# Patient Record
Sex: Male | Born: 1945 | Race: White | Hispanic: No | Marital: Married | Smoking: Never smoker
Health system: Southern US, Community
[De-identification: ages and names within clinical notes are randomized; demographics above are authoritative.]

## PROBLEM LIST (undated history)

## (undated) DIAGNOSIS — I2510 Atherosclerotic heart disease of native coronary artery without angina pectoris: Secondary | ICD-10-CM

## (undated) DIAGNOSIS — T7840XA Allergy, unspecified, initial encounter: Secondary | ICD-10-CM

## (undated) DIAGNOSIS — G709 Myoneural disorder, unspecified: Secondary | ICD-10-CM

## (undated) DIAGNOSIS — U071 COVID-19: Secondary | ICD-10-CM

## (undated) DIAGNOSIS — I219 Acute myocardial infarction, unspecified: Secondary | ICD-10-CM

## (undated) DIAGNOSIS — C801 Malignant (primary) neoplasm, unspecified: Secondary | ICD-10-CM

## (undated) HISTORY — PX: CARDIAC CATHETERIZATION: SHX172

## (undated) HISTORY — DX: Allergy, unspecified, initial encounter: T78.40XA

## (undated) HISTORY — PX: ROTATOR CUFF REPAIR: SHX139

## (undated) HISTORY — PX: ROBOT ASSISTED LAPAROSCOPIC RADICAL PROSTATECTOMY: SHX5141

## (undated) HISTORY — DX: Acute myocardial infarction, unspecified: I21.9

## (undated) HISTORY — DX: Myoneural disorder, unspecified: G70.9

## (undated) HISTORY — DX: Malignant (primary) neoplasm, unspecified: C80.1

## (undated) HISTORY — DX: COVID-19: U07.1

## (undated) HISTORY — PX: COLONOSCOPY: SHX174

## (undated) HISTORY — DX: Atherosclerotic heart disease of native coronary artery without angina pectoris: I25.10

## (undated) HISTORY — PX: POLYPECTOMY: SHX149

## (undated) HISTORY — PX: OTHER SURGICAL HISTORY: SHX169

---

## 2004-12-12 ENCOUNTER — Ambulatory Visit: Payer: Self-pay | Admitting: Family Medicine

## 2008-06-19 ENCOUNTER — Inpatient Hospital Stay (HOSPITAL_COMMUNITY): Admission: RE | Admit: 2008-06-19 | Discharge: 2008-06-20 | Payer: Self-pay | Admitting: Urology

## 2008-06-19 ENCOUNTER — Encounter (INDEPENDENT_AMBULATORY_CARE_PROVIDER_SITE_OTHER): Payer: Self-pay | Admitting: Urology

## 2009-02-24 IMAGING — CR DG CHEST 2V
2 series · 2 of 2 positions shown · non-contrast
Comparison: None

CLINICAL DATA: Preop for surgery for prostate carcinoma

CHEST - 2 VIEW

[w chest pa]
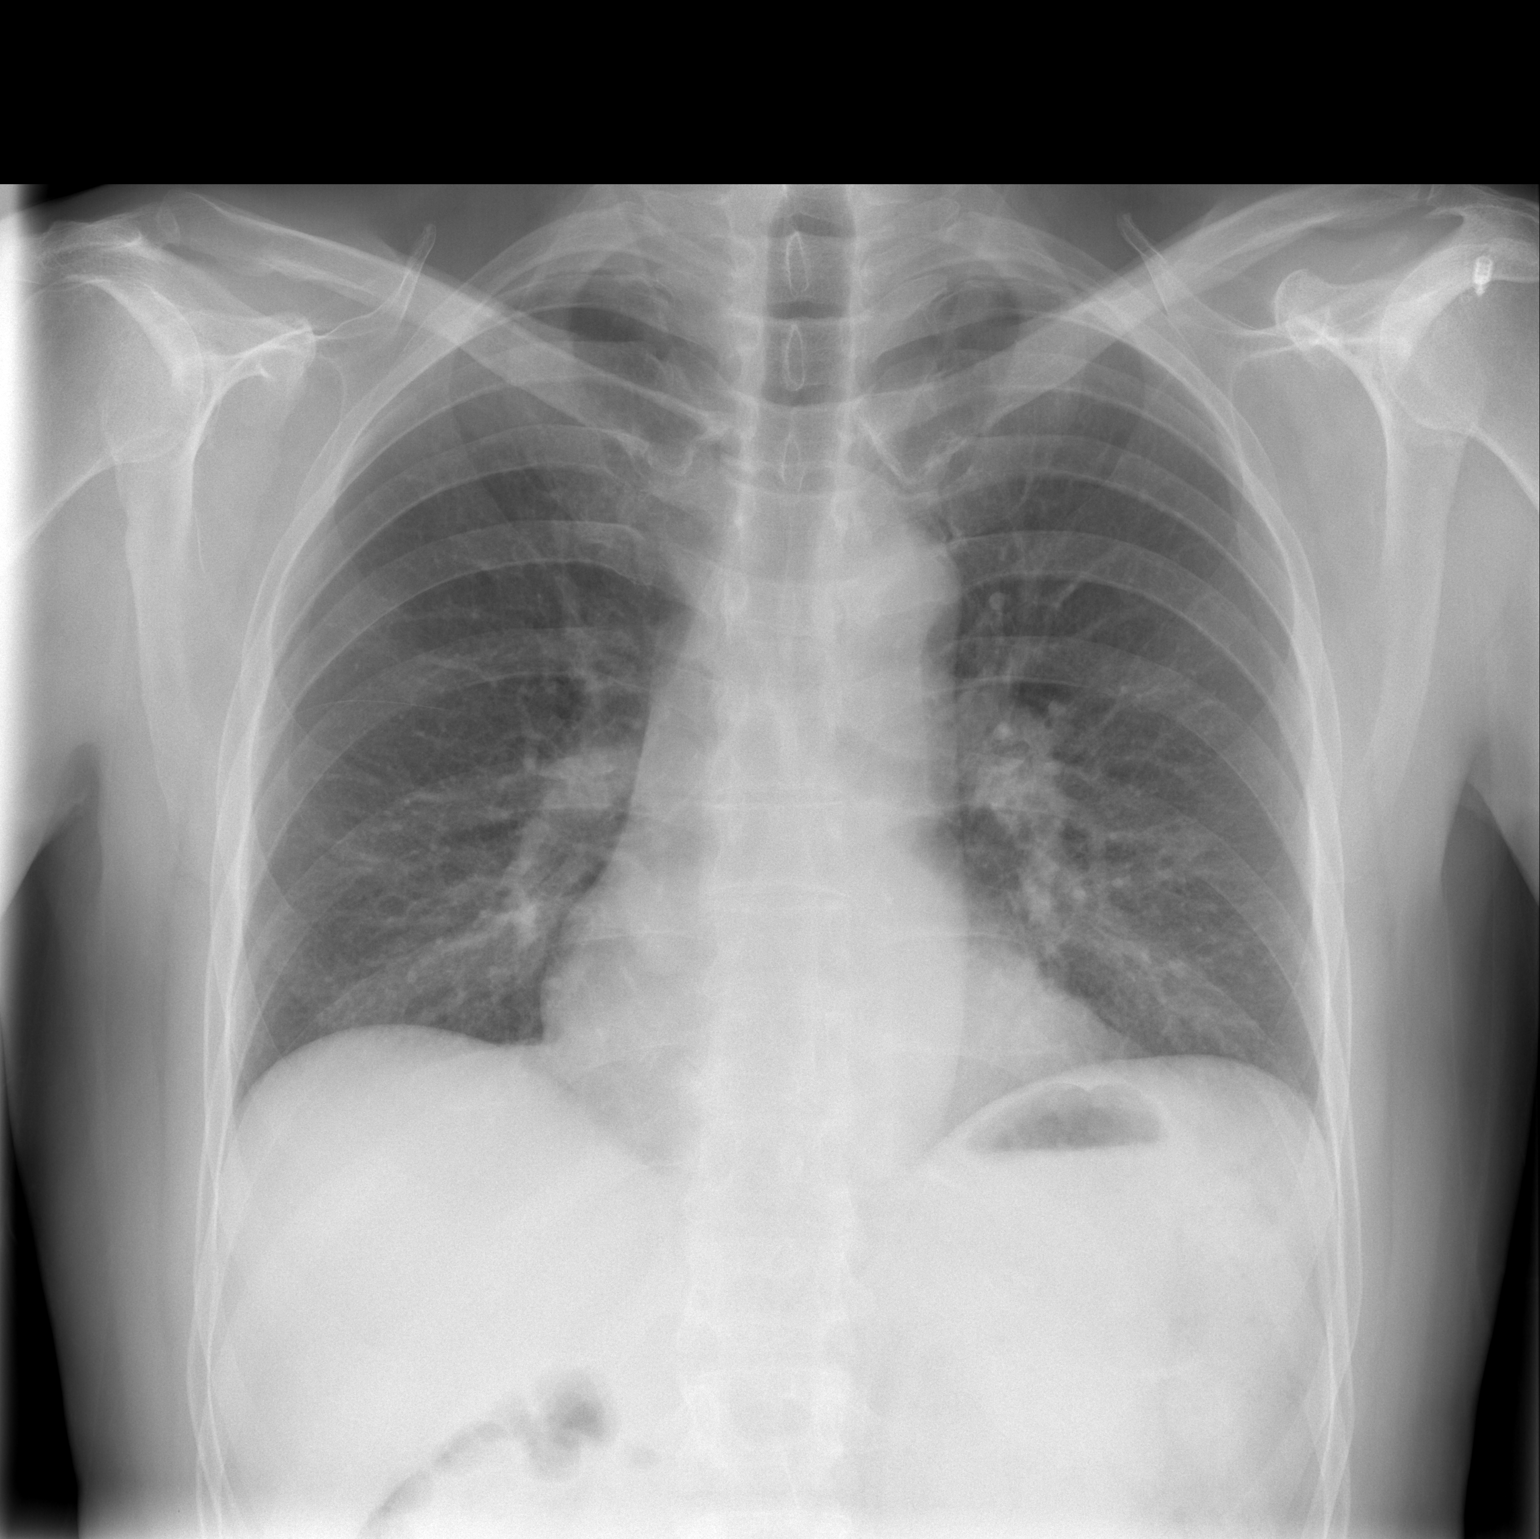

[w chest lat]
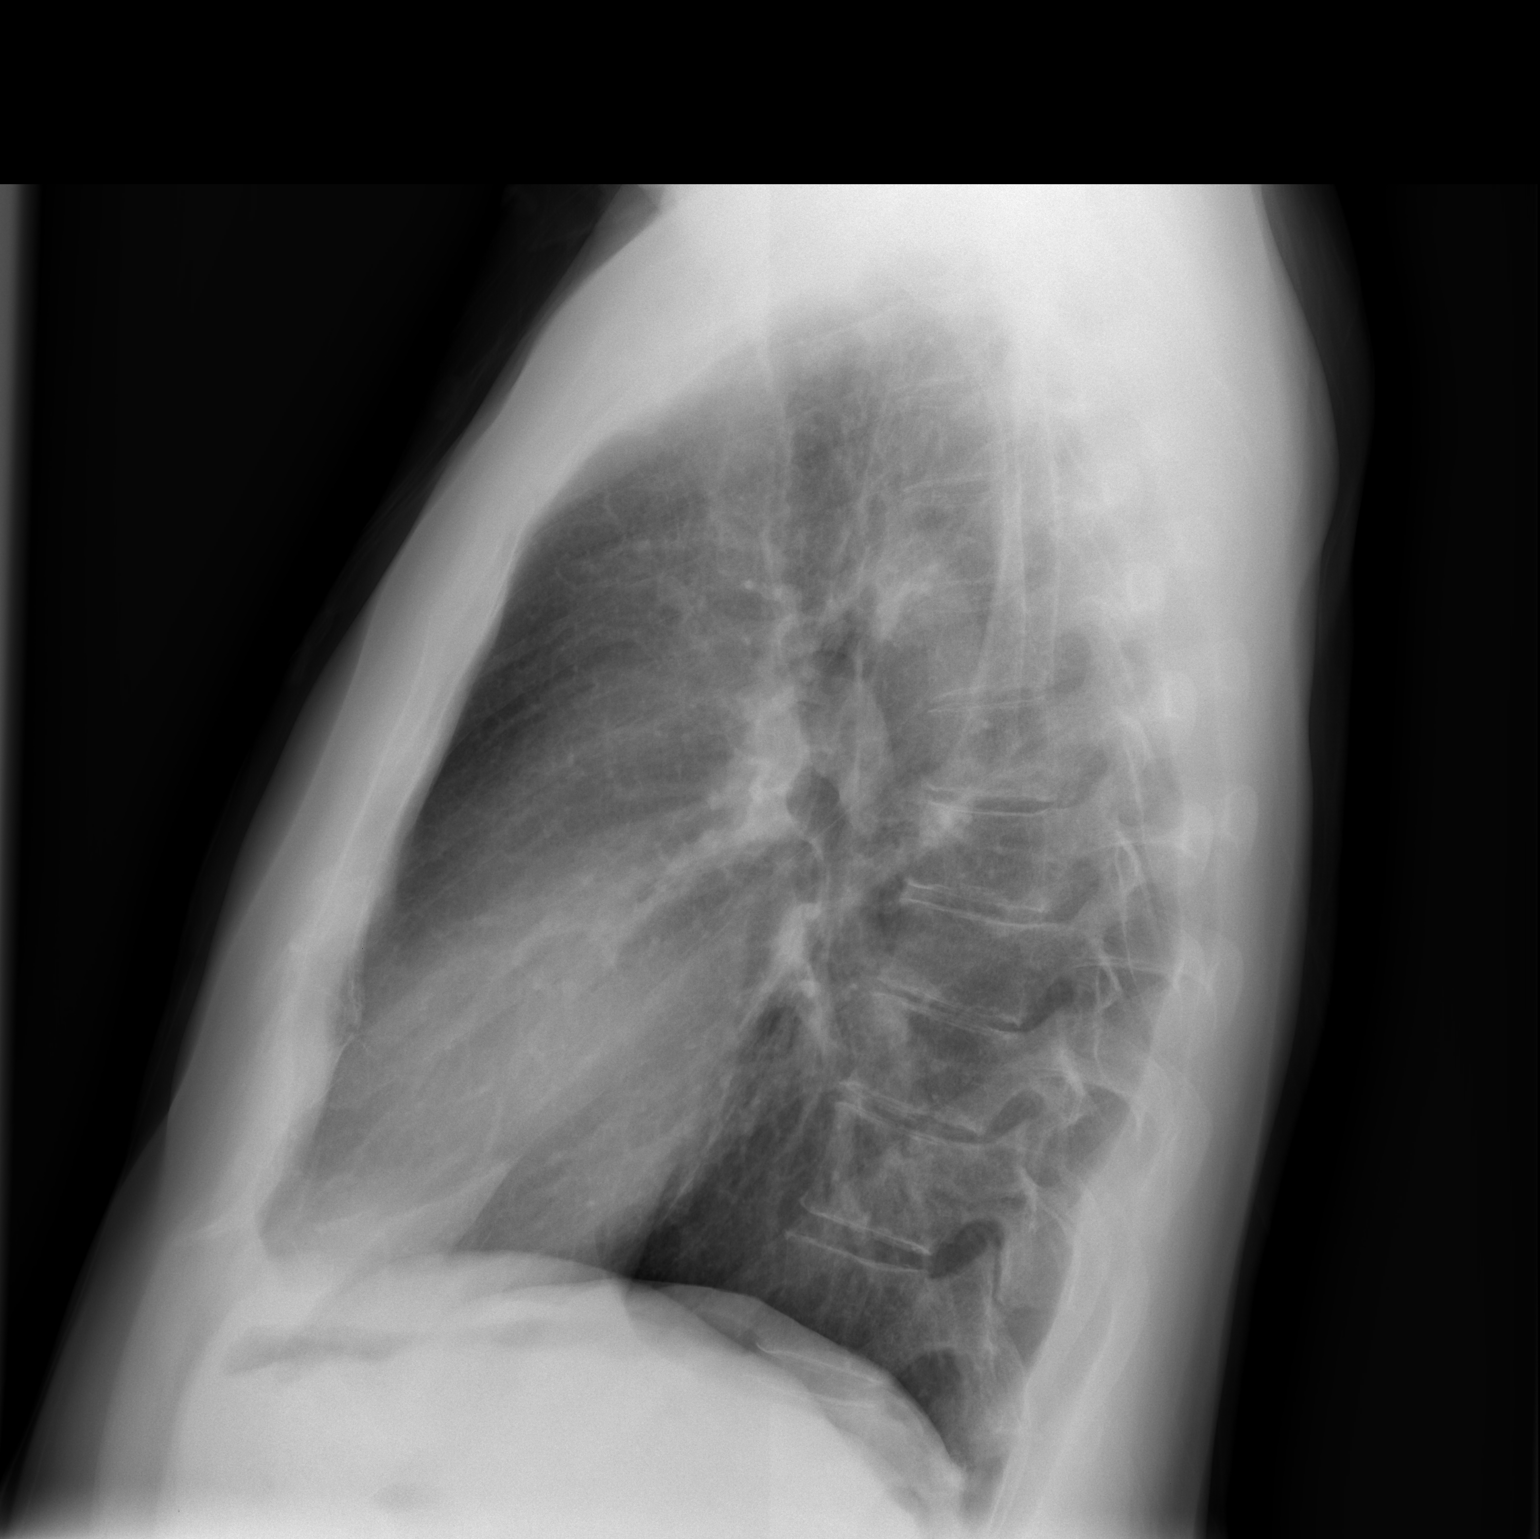

[2 of 2 positions shown; findings below may reference images not displayed]

FINDINGS: The lungs are clear.  Mild peribronchial thickening is
noted.  The heart is within normal limits in size.  No bony
abnormality is seen.
IMPRESSION: No active lung disease.

## 2011-03-16 NOTE — Op Note (Signed)
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   POSTOPERATIVE DIAGNOSIS:  Clinically localized adenocarcinoma of  prostate (clinical stage T1c NX MX).   PROCEDURES:  Robotic-assisted laparoscopic radical prostatectomy  (bilateral nerve sparing).   SURGEON:  Dr. Heloise Purpura.   ASSISTANTS:  Dr. Delman Kitten and Delia Chimes, nurse practitioner.   ANESTHESIA:  General.   COMPLICATIONS:  None.   ESTIMATED BLOOD LOSS:  350 mL.   COMPLICATIONS:  None.   SPECIMENS:  Prostate and seminal vesicles.   DISPOSITION:  Specimen to pathology.   DRAINS:  1. A #19 pelvic Blake drain.  2. A 20-French coude catheter.   INDICATIONS:  [name] is a 65 year old gentleman with clinically  localized adenocarcinoma of the prostate.  After a discussion regarding  management options for treatment, he elected to proceed with surgical  treatment and the above procedure.  The potential risks, complications,  and alternative options were discussed with the patient in detail and  informed consent was obtained.   DESCRIPTION OF PROCEDURE:  The patient was taken to the operating room  and a general anesthetic was administered.  He was given preoperative  antibiotics, placed in the dorsal lithotomy position, and prepped and  draped in the usual sterile fashion.  Next, a preoperative time-out was  performed.  A Foley catheter was inserted into the bladder and a site  was selected just to left of the umbilicus for placement of the camera  port.  This was placed using a standard open Hasson technique.   This  allowed entry into the peritoneal cavity under direct vision without  difficulty.  A 12-mm port was then placed and pneumoperitoneum was  established.  A 0-degree lens was used to inspect the abdomen and there  was no evidence for any intra-abdominal injuries or other abnormalities.  The remaining ports were then placed.  Bilateral 8-mm robotic ports were  placed 10 cm lateral to and just inferior to the camera port site.  An  additional 8-mm robotic port was placed in the far left lateral  abdominal wall.  A 5-mm port was placed between the camera port and the  right robotic port and an additional 12-mm port was placed in the far  right lateral abdominal wall for laparoscopic assistance.  All ports  were placed under direct vision without difficulty.  The surgical cart  was then docked.  With the aid of the cautery scissors, the bladder was  reflected posteriorly, allowing entry into the space of Retzius and  identification of the endopelvic fascia and prostate.  The endopelvic  fascia was then incised from the apex back to the base of the prostate  bilaterally and the underlying levator muscle fibers were swept  laterally off the prostate, thereby isolating the dorsal venous complex.  The dorsal vein was then stapled and divided with a 45-mm flex ETS  stapler.  The bladder neck was identified with the aid of Foley catheter  manipulation, and the anterior bladder neck was entered.  The Foley  catheter was identified and the catheter balloon was deflated with the  catheter brought into the operative field and used to retract the  prostate anteriorly.  The posterior bladder neck was identified and  divided.  Dissection proceeded between the bladder neck and prostate  until the vasa deferentia and seminal vesicles were identified.  The  vasa deferentia were isolated, divided and lifted anteriorly.  The  seminal vesicles were dissected down to their tips, with care to control  the  seminal vesicle arterial blood supply.  The seminal vesicles were  then lifted anteriorly.  The space between Denonvilliers' fascia and the  anterior rectum was then bluntly developed, thereby isolating the  vascular pedicles of the prostate.  The lateral prostatic fascia was  then sharply incised and the neurovascular bundles were released  posteriorly and laterally.  The vascular pedicles of the prostate were  then ligated with Hem-o-lok clips above the level of the neurovascular  bundles and divided with sharp cold scissor dissection.  The  neurovascular bundles were swept off the apex of the prostate and  urethra and urethra was sharply divided, allowing the prostate specimen  to be disarticulated.  The pelvis was then copiously irrigated and  hemostasis was ensured.  There was no evidence of a rectal injury.  Attention then turned to the urethral anastomosis.  A 2-0 Vicryl slip-  knot was placed between Denonvilliers' fascia, the posterior bladder  neck, and the posterior urethra to reapproximate these structures.  A  double-armed 3-0 Monocryl suture was then used to perform a 360-degree  running tension-free anastomosis between the bladder neck and urethra.  A new 20-French coude catheter was inserted into the bladder and  irrigated.  There were no blood clots within the bladder and the  anastomosis appeared to be watertight.  The left lateral robotic port  was removed after a #19 Blake drain was placed and appropriately  positioned.  It was secured to the skin with a nylon suture.  The  surgical cart was then undocked.  The right lateral 12-mm port site was  then closed with a 0 Vicryl suture placed with the aid of the suture  passer device.  All remaining ports were removed under direct vision and  the prostate specimen was removed intact within the Endopouch retrieval  bag via the periumbilical port site.  This fascial opening was closed  with a running 0 Vicryl suture.  All  incision sites were then injected  with 0.25% Marcaine and reapproximated at the skin level with staples.  Sterile dressings were applied.  The patient appeared to tolerate the  procedure well without complications.  He was able to be extubated and  transferred to the recovery unit in satisfactory condition.      Heloise Purpura, MD  Electronically Signed     LB/MEDQ  D:  06/19/2008  T:  06/19/2008  Job:  [id number]

## 2011-07-30 LAB — CBC
Hemoglobin: 14.2
RBC: 4.49

## 2011-07-30 LAB — BASIC METABOLIC PANEL
Calcium: 9.2
Creatinine, Ser: 1.1
GFR calc Af Amer: >60
GFR calc non Af Amer: >60
Sodium: 145

## 2016-01-12 DIAGNOSIS — R109 Unspecified abdominal pain: Secondary | ICD-10-CM | POA: Diagnosis not present

## 2016-01-12 DIAGNOSIS — Z6827 Body mass index (BMI) 27.0-27.9, adult: Secondary | ICD-10-CM | POA: Diagnosis not present

## 2016-03-10 DIAGNOSIS — Z1389 Encounter for screening for other disorder: Secondary | ICD-10-CM | POA: Diagnosis not present

## 2016-03-10 DIAGNOSIS — Z139 Encounter for screening, unspecified: Secondary | ICD-10-CM | POA: Diagnosis not present

## 2016-03-10 DIAGNOSIS — Z9181 History of falling: Secondary | ICD-10-CM | POA: Diagnosis not present

## 2016-03-10 DIAGNOSIS — Z0000 Encounter for general adult medical examination without abnormal findings: Secondary | ICD-10-CM | POA: Diagnosis not present

## 2016-03-20 DIAGNOSIS — Y939 Activity, unspecified: Secondary | ICD-10-CM | POA: Diagnosis not present

## 2016-03-20 DIAGNOSIS — X58XXXA Exposure to other specified factors, initial encounter: Secondary | ICD-10-CM | POA: Diagnosis not present

## 2016-03-20 DIAGNOSIS — T671XXA Heat syncope, initial encounter: Secondary | ICD-10-CM | POA: Diagnosis not present

## 2016-03-20 DIAGNOSIS — E860 Dehydration: Secondary | ICD-10-CM | POA: Diagnosis not present

## 2016-03-20 DIAGNOSIS — R55 Syncope and collapse: Secondary | ICD-10-CM | POA: Diagnosis not present

## 2016-03-20 DIAGNOSIS — R404 Transient alteration of awareness: Secondary | ICD-10-CM | POA: Diagnosis not present

## 2016-03-22 DIAGNOSIS — T675XXA Heat exhaustion, unspecified, initial encounter: Secondary | ICD-10-CM | POA: Diagnosis not present

## 2016-03-22 DIAGNOSIS — Z7189 Other specified counseling: Secondary | ICD-10-CM | POA: Diagnosis not present

## 2016-03-22 DIAGNOSIS — R599 Enlarged lymph nodes, unspecified: Secondary | ICD-10-CM | POA: Diagnosis not present

## 2016-03-22 DIAGNOSIS — Z6827 Body mass index (BMI) 27.0-27.9, adult: Secondary | ICD-10-CM | POA: Diagnosis not present

## 2016-06-17 DIAGNOSIS — Z1210 Encounter for screening for malignant neoplasm of intestinal tract, unspecified: Secondary | ICD-10-CM | POA: Diagnosis not present

## 2016-06-17 DIAGNOSIS — Z125 Encounter for screening for malignant neoplasm of prostate: Secondary | ICD-10-CM | POA: Diagnosis not present

## 2016-06-17 DIAGNOSIS — Z13220 Encounter for screening for lipoid disorders: Secondary | ICD-10-CM | POA: Diagnosis not present

## 2016-06-17 DIAGNOSIS — Z8546 Personal history of malignant neoplasm of prostate: Secondary | ICD-10-CM | POA: Diagnosis not present

## 2016-06-17 DIAGNOSIS — Z0000 Encounter for general adult medical examination without abnormal findings: Secondary | ICD-10-CM | POA: Diagnosis not present

## 2016-06-17 DIAGNOSIS — Z131 Encounter for screening for diabetes mellitus: Secondary | ICD-10-CM | POA: Diagnosis not present

## 2016-09-14 DIAGNOSIS — C44311 Basal cell carcinoma of skin of nose: Secondary | ICD-10-CM | POA: Diagnosis not present

## 2016-11-12 DIAGNOSIS — N5201 Erectile dysfunction due to arterial insufficiency: Secondary | ICD-10-CM | POA: Diagnosis not present

## 2016-11-12 DIAGNOSIS — C61 Malignant neoplasm of prostate: Secondary | ICD-10-CM | POA: Diagnosis not present

## 2016-12-07 DIAGNOSIS — L905 Scar conditions and fibrosis of skin: Secondary | ICD-10-CM | POA: Diagnosis not present

## 2016-12-07 DIAGNOSIS — C44311 Basal cell carcinoma of skin of nose: Secondary | ICD-10-CM | POA: Diagnosis not present

## 2017-02-08 DIAGNOSIS — C44311 Basal cell carcinoma of skin of nose: Secondary | ICD-10-CM | POA: Diagnosis not present

## 2017-02-22 DIAGNOSIS — Z0000 Encounter for general adult medical examination without abnormal findings: Secondary | ICD-10-CM | POA: Diagnosis not present

## 2017-02-22 DIAGNOSIS — Z1389 Encounter for screening for other disorder: Secondary | ICD-10-CM | POA: Diagnosis not present

## 2017-02-22 DIAGNOSIS — Z139 Encounter for screening, unspecified: Secondary | ICD-10-CM | POA: Diagnosis not present

## 2017-10-03 DIAGNOSIS — Z13220 Encounter for screening for lipoid disorders: Secondary | ICD-10-CM | POA: Diagnosis not present

## 2017-10-03 DIAGNOSIS — Z8546 Personal history of malignant neoplasm of prostate: Secondary | ICD-10-CM | POA: Diagnosis not present

## 2017-10-03 DIAGNOSIS — Z0000 Encounter for general adult medical examination without abnormal findings: Secondary | ICD-10-CM | POA: Diagnosis not present

## 2017-10-03 DIAGNOSIS — Z1211 Encounter for screening for malignant neoplasm of colon: Secondary | ICD-10-CM | POA: Diagnosis not present

## 2018-05-29 DIAGNOSIS — K4090 Unilateral inguinal hernia, without obstruction or gangrene, not specified as recurrent: Secondary | ICD-10-CM | POA: Diagnosis not present

## 2018-05-29 DIAGNOSIS — Z6826 Body mass index (BMI) 26.0-26.9, adult: Secondary | ICD-10-CM | POA: Diagnosis not present

## 2018-05-29 DIAGNOSIS — Z139 Encounter for screening, unspecified: Secondary | ICD-10-CM | POA: Diagnosis not present

## 2018-05-29 DIAGNOSIS — E663 Overweight: Secondary | ICD-10-CM | POA: Diagnosis not present

## 2018-06-07 DIAGNOSIS — K4020 Bilateral inguinal hernia, without obstruction or gangrene, not specified as recurrent: Secondary | ICD-10-CM | POA: Diagnosis not present

## 2018-06-12 DIAGNOSIS — K429 Umbilical hernia without obstruction or gangrene: Secondary | ICD-10-CM | POA: Diagnosis not present

## 2018-06-12 DIAGNOSIS — D176 Benign lipomatous neoplasm of spermatic cord: Secondary | ICD-10-CM | POA: Diagnosis not present

## 2018-06-12 DIAGNOSIS — G8918 Other acute postprocedural pain: Secondary | ICD-10-CM | POA: Diagnosis not present

## 2018-06-12 DIAGNOSIS — K4020 Bilateral inguinal hernia, without obstruction or gangrene, not specified as recurrent: Secondary | ICD-10-CM | POA: Diagnosis not present

## 2018-06-20 ENCOUNTER — Other Ambulatory Visit: Payer: Self-pay | Admitting: *Deleted

## 2018-06-20 NOTE — Patient Outreach (Signed)
[facility]) Care Management  06/20/2018  Helix Lafontaine 1946-10-13 [id number]  Referral via [place] discharged from inpatient admission 06/12/2018:  "TOC will be completed by primary care office who will refer to [facility] care management if needed.  Telephone call to patient; spouse answered call & advised that patient was not available. Spouse advised that she would have patient return call.  Plan: Geophysicist/field seismologist. Follow up 2-4 business days.  Sherrin Daisy, RN BSN [place] Management Coordinator [facility] Care Management  [phone]

## 2018-06-22 DIAGNOSIS — Z09 Encounter for follow-up examination after completed treatment for conditions other than malignant neoplasm: Secondary | ICD-10-CM | POA: Insufficient documentation

## 2018-06-26 ENCOUNTER — Other Ambulatory Visit: Payer: Self-pay | Admitting: *Deleted

## 2018-06-26 NOTE — Patient Outreach (Signed)
[facility]) Care Management  06/26/2018  Curtis Olson 1946-04-23 [id number]   Referral via [place] discharged from inpatient admission 06/12/2018:  Received return call from, patient who was advised of reason for call. HIPPA verification received from patient.   Patient voices that he is home with spouse and doing well since recent hospital stay for hernia repairs.   States he has already seen surgeon and everything looks fine with no signs of infection. States he is aware of symptoms that he needs to report to his MD.  Voices he has physical scheduled with primary care in 3 weeks. Advised patient to touch base with primary care provider to see if they wanted to see him earlier since he was recently hospitalized. Patient voices understanding and states he will call.   States he is not on any routine medications and does not have any chronic illnesses. States he is taking Advil for pain.  States he is walking daily and trying to build strength up. States he has not had any falls.  Voices he is doing fine and does not need [facility] services  this time.    Sherrin Daisy, RN BSN [place] Management Coordinator [facility] Care Management  [phone]

## 2018-06-26 NOTE — Patient Outreach (Signed)
[facility] [facility]) Care Management  06/26/2018  Curtis Olson 1946/01/25 [id number]  Referral via [place] discharged from inpatient admission 06/12/2018:  "TOC will be completed by primary care office who will refer to [facility] care management if needed.  Telephone call #2 to patient; left HIPPA compliant voice mail requesting return call.   Plan: Follow up in 2-4 days. Outreach letter was sent 06/20/2018.  Sherrin Daisy, RN BSN [place] Management Coordinator [facility] Care Management  [phone]

## 2018-06-28 ENCOUNTER — Ambulatory Visit: Payer: Self-pay | Admitting: *Deleted

## 2018-07-14 DIAGNOSIS — Z1331 Encounter for screening for depression: Secondary | ICD-10-CM | POA: Diagnosis not present

## 2018-07-14 DIAGNOSIS — Z8546 Personal history of malignant neoplasm of prostate: Secondary | ICD-10-CM | POA: Diagnosis not present

## 2018-07-14 DIAGNOSIS — Z1211 Encounter for screening for malignant neoplasm of colon: Secondary | ICD-10-CM | POA: Diagnosis not present

## 2018-07-14 DIAGNOSIS — Z139 Encounter for screening, unspecified: Secondary | ICD-10-CM | POA: Diagnosis not present

## 2018-07-14 DIAGNOSIS — R748 Abnormal levels of other serum enzymes: Secondary | ICD-10-CM | POA: Diagnosis not present

## 2018-07-14 DIAGNOSIS — Z0000 Encounter for general adult medical examination without abnormal findings: Secondary | ICD-10-CM | POA: Diagnosis not present

## 2018-07-14 DIAGNOSIS — E785 Hyperlipidemia, unspecified: Secondary | ICD-10-CM | POA: Diagnosis not present

## 2018-08-31 DIAGNOSIS — R0683 Snoring: Secondary | ICD-10-CM | POA: Diagnosis not present

## 2018-08-31 DIAGNOSIS — Z6826 Body mass index (BMI) 26.0-26.9, adult: Secondary | ICD-10-CM | POA: Diagnosis not present

## 2018-08-31 DIAGNOSIS — Z13220 Encounter for screening for lipoid disorders: Secondary | ICD-10-CM | POA: Diagnosis not present

## 2018-08-31 DIAGNOSIS — Z131 Encounter for screening for diabetes mellitus: Secondary | ICD-10-CM | POA: Diagnosis not present

## 2018-11-02 DIAGNOSIS — R0789 Other chest pain: Secondary | ICD-10-CM | POA: Diagnosis not present

## 2018-11-02 DIAGNOSIS — Z955 Presence of coronary angioplasty implant and graft: Secondary | ICD-10-CM | POA: Diagnosis not present

## 2018-11-02 DIAGNOSIS — I2510 Atherosclerotic heart disease of native coronary artery without angina pectoris: Secondary | ICD-10-CM | POA: Diagnosis not present

## 2018-11-02 DIAGNOSIS — Z8249 Family history of ischemic heart disease and other diseases of the circulatory system: Secondary | ICD-10-CM | POA: Diagnosis not present

## 2018-11-02 DIAGNOSIS — I214 Non-ST elevation (NSTEMI) myocardial infarction: Secondary | ICD-10-CM | POA: Diagnosis not present

## 2018-11-02 DIAGNOSIS — I5020 Unspecified systolic (congestive) heart failure: Secondary | ICD-10-CM | POA: Diagnosis not present

## 2018-11-02 DIAGNOSIS — Z6826 Body mass index (BMI) 26.0-26.9, adult: Secondary | ICD-10-CM | POA: Diagnosis not present

## 2018-11-02 DIAGNOSIS — M545 Low back pain: Secondary | ICD-10-CM | POA: Diagnosis not present

## 2018-11-02 DIAGNOSIS — R001 Bradycardia, unspecified: Secondary | ICD-10-CM | POA: Diagnosis not present

## 2018-11-02 DIAGNOSIS — E785 Hyperlipidemia, unspecified: Secondary | ICD-10-CM | POA: Diagnosis not present

## 2018-11-02 DIAGNOSIS — I454 Nonspecific intraventricular block: Secondary | ICD-10-CM | POA: Diagnosis not present

## 2018-11-02 DIAGNOSIS — Z7982 Long term (current) use of aspirin: Secondary | ICD-10-CM | POA: Diagnosis not present

## 2018-11-02 DIAGNOSIS — M549 Dorsalgia, unspecified: Secondary | ICD-10-CM | POA: Diagnosis not present

## 2018-11-02 DIAGNOSIS — I700 Atherosclerosis of aorta: Secondary | ICD-10-CM | POA: Diagnosis not present

## 2018-11-02 DIAGNOSIS — E7849 Other hyperlipidemia: Secondary | ICD-10-CM | POA: Diagnosis not present

## 2018-11-02 DIAGNOSIS — R072 Precordial pain: Secondary | ICD-10-CM | POA: Diagnosis not present

## 2018-11-02 DIAGNOSIS — I4510 Unspecified right bundle-branch block: Secondary | ICD-10-CM | POA: Diagnosis not present

## 2018-11-02 DIAGNOSIS — R531 Weakness: Secondary | ICD-10-CM | POA: Diagnosis not present

## 2018-11-02 DIAGNOSIS — Z8042 Family history of malignant neoplasm of prostate: Secondary | ICD-10-CM | POA: Diagnosis not present

## 2018-11-02 DIAGNOSIS — R079 Chest pain, unspecified: Secondary | ICD-10-CM | POA: Diagnosis not present

## 2018-11-03 DIAGNOSIS — I4510 Unspecified right bundle-branch block: Secondary | ICD-10-CM | POA: Diagnosis not present

## 2018-11-07 ENCOUNTER — Other Ambulatory Visit: Payer: Self-pay | Admitting: *Deleted

## 2018-11-07 NOTE — Patient Outreach (Addendum)
[place] [facility]) Care Management  11/07/2018  Curtis Olson 06-03-1946 [id number]   Transition of Care Referral   Referral Date: 11/07/2018  Referral Source: HTA IP discharge Date of Admission: unknown Diagnosis: unknown Date of Discharge:  on 11/04/18 Facility: [facility] Insurance: HTA    Transition of care services noted to be completed by primary care MD office staff also [facility]  Outreach attempt # 1 No answer. [facility] RN CM left HIPAA compliant voicemail message along with CM's contact info.   Plan: [facility] RN CM sent an unsuccessful outreach letter and scheduled this patient for another call attempt within 4 business days   Kimberly L. Lavina Hamman, RN, BSN, [place] Management Care Coordinator Direct Number [phone] Mobile number [phone]  Main [facility] number [phone] Fax number [phone]

## 2018-11-08 ENCOUNTER — Other Ambulatory Visit: Payer: Self-pay | Admitting: *Deleted

## 2018-11-08 NOTE — Patient Outreach (Signed)
[facility]) Care Management  11/08/2018  Chaka Jefferys December 16, 1945 [id number]   Opened encounter in error  [place] L. Lavina Hamman, RN, BSN, [place] Coordinator Office number [phone] Mobile number [phone]  Main [facility] number [phone] Fax number [phone]

## 2018-11-09 ENCOUNTER — Other Ambulatory Visit: Payer: Self-pay | Admitting: *Deleted

## 2018-11-09 NOTE — Patient Outreach (Signed)
[facility]) Care Management  11/09/2018  Curtis Olson 02-02-1946 [id number]   Transition of Care Referral  Referral Date: 11/07/2018  Referral Source: HTA IP discharge Date of Admission: 11/02/2018 Diagnosis: NSTEMI, chest pain, Stent placement-Let heart cath with coronary angiography Date of Discharge:  on 11/04/18 Facility: [facility] Insurance: HTA   Patient returned a call to [facility] RN CM Patient is able to verify HIPAA Reviewed and addressed EMMI red alert/referral to [facility] with patient Curtis Olson confirms his d/c from [place] center after having Chest pain, NSTEMI and stents placed   He reports his stent site is clean, dry, not swelling He informed Cm he cleaned the site to day and has left it open to air   Social: Curtis Olson lives at home with his wife (of "67 years"), [name]. 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 He was noted to have sniffles during the screening call He was offered assistance of [facility] pharmacy staff but prefers to discussed his s/s with his primary MD on 11/10/2018 and will call [facility] CM if pharmacy assist is needed    Appointments: He is scheduled to see Dr Nyra Capes on 11/10/18 - Reports being seen last   07/14/18 He is scheduled to se the cardiologist, [place] on 12/05/18   Advance Directives: Denies need for assist with advance directives    Consent: [facility] RN CM reviewed [facility] services with patient. Patient gave verbal consent for services. He denies need of services from [facility]/Telephonic RN CM, pharmacy, health coach, NP or SW at this time    Plans [facility] RN CM will close case at this time as patient has been assessed and no needs identified.    Pt encouraged to return a call to [facility] RN CM prn   [name], RN, BSN, [place] Coordinator Office number [phone] Mobile number [phone]  Main [facility] number [phone] Fax number [phone]

## 2018-11-10 DIAGNOSIS — I2510 Atherosclerotic heart disease of native coronary artery without angina pectoris: Secondary | ICD-10-CM | POA: Diagnosis not present

## 2018-11-10 DIAGNOSIS — Z1331 Encounter for screening for depression: Secondary | ICD-10-CM | POA: Diagnosis not present

## 2018-11-10 DIAGNOSIS — Z139 Encounter for screening, unspecified: Secondary | ICD-10-CM | POA: Diagnosis not present

## 2018-11-10 DIAGNOSIS — Z6826 Body mass index (BMI) 26.0-26.9, adult: Secondary | ICD-10-CM | POA: Diagnosis not present

## 2018-11-10 DIAGNOSIS — J0100 Acute maxillary sinusitis, unspecified: Secondary | ICD-10-CM | POA: Diagnosis not present

## 2018-11-10 DIAGNOSIS — Z7189 Other specified counseling: Secondary | ICD-10-CM | POA: Diagnosis not present

## 2018-12-05 DIAGNOSIS — I214 Non-ST elevation (NSTEMI) myocardial infarction: Secondary | ICD-10-CM | POA: Diagnosis not present

## 2018-12-05 DIAGNOSIS — E7800 Pure hypercholesterolemia, unspecified: Secondary | ICD-10-CM | POA: Diagnosis not present

## 2018-12-11 DIAGNOSIS — I2510 Atherosclerotic heart disease of native coronary artery without angina pectoris: Secondary | ICD-10-CM | POA: Diagnosis not present

## 2018-12-11 DIAGNOSIS — Z6826 Body mass index (BMI) 26.0-26.9, adult: Secondary | ICD-10-CM | POA: Diagnosis not present

## 2018-12-11 DIAGNOSIS — Z139 Encounter for screening, unspecified: Secondary | ICD-10-CM | POA: Diagnosis not present

## 2018-12-11 DIAGNOSIS — R42 Dizziness and giddiness: Secondary | ICD-10-CM | POA: Diagnosis not present

## 2018-12-12 DIAGNOSIS — Z955 Presence of coronary angioplasty implant and graft: Secondary | ICD-10-CM | POA: Diagnosis not present

## 2018-12-12 DIAGNOSIS — Z79899 Other long term (current) drug therapy: Secondary | ICD-10-CM | POA: Diagnosis not present

## 2018-12-12 DIAGNOSIS — Z7982 Long term (current) use of aspirin: Secondary | ICD-10-CM | POA: Diagnosis not present

## 2019-01-01 DIAGNOSIS — E785 Hyperlipidemia, unspecified: Secondary | ICD-10-CM | POA: Diagnosis not present

## 2019-01-03 DIAGNOSIS — Z7982 Long term (current) use of aspirin: Secondary | ICD-10-CM | POA: Diagnosis not present

## 2019-01-03 DIAGNOSIS — Z955 Presence of coronary angioplasty implant and graft: Secondary | ICD-10-CM | POA: Diagnosis not present

## 2019-01-03 DIAGNOSIS — Z79899 Other long term (current) drug therapy: Secondary | ICD-10-CM | POA: Diagnosis not present

## 2019-04-05 DIAGNOSIS — E785 Hyperlipidemia, unspecified: Secondary | ICD-10-CM | POA: Diagnosis not present

## 2019-04-05 DIAGNOSIS — I2510 Atherosclerotic heart disease of native coronary artery without angina pectoris: Secondary | ICD-10-CM | POA: Diagnosis not present

## 2019-04-12 DIAGNOSIS — Z139 Encounter for screening, unspecified: Secondary | ICD-10-CM | POA: Diagnosis not present

## 2019-04-12 DIAGNOSIS — E785 Hyperlipidemia, unspecified: Secondary | ICD-10-CM | POA: Diagnosis not present

## 2019-04-12 DIAGNOSIS — Z1331 Encounter for screening for depression: Secondary | ICD-10-CM | POA: Diagnosis not present

## 2019-04-12 DIAGNOSIS — Z6825 Body mass index (BMI) 25.0-25.9, adult: Secondary | ICD-10-CM | POA: Diagnosis not present

## 2019-04-12 DIAGNOSIS — Z0000 Encounter for general adult medical examination without abnormal findings: Secondary | ICD-10-CM | POA: Diagnosis not present

## 2019-04-12 DIAGNOSIS — D692 Other nonthrombocytopenic purpura: Secondary | ICD-10-CM | POA: Diagnosis not present

## 2019-04-12 DIAGNOSIS — I2510 Atherosclerotic heart disease of native coronary artery without angina pectoris: Secondary | ICD-10-CM | POA: Diagnosis not present

## 2019-06-04 ENCOUNTER — Other Ambulatory Visit: Payer: Self-pay

## 2019-06-20 DIAGNOSIS — M25571 Pain in right ankle and joints of right foot: Secondary | ICD-10-CM | POA: Diagnosis not present

## 2019-06-20 DIAGNOSIS — Z6825 Body mass index (BMI) 25.0-25.9, adult: Secondary | ICD-10-CM | POA: Diagnosis not present

## 2019-06-20 DIAGNOSIS — M25471 Effusion, right ankle: Secondary | ICD-10-CM | POA: Diagnosis not present

## 2019-06-28 DIAGNOSIS — Z6825 Body mass index (BMI) 25.0-25.9, adult: Secondary | ICD-10-CM | POA: Diagnosis not present

## 2019-06-28 DIAGNOSIS — M25471 Effusion, right ankle: Secondary | ICD-10-CM | POA: Diagnosis not present

## 2019-07-05 DIAGNOSIS — M25471 Effusion, right ankle: Secondary | ICD-10-CM | POA: Diagnosis not present

## 2019-07-05 DIAGNOSIS — Z6826 Body mass index (BMI) 26.0-26.9, adult: Secondary | ICD-10-CM | POA: Diagnosis not present

## 2019-08-09 DIAGNOSIS — Z23 Encounter for immunization: Secondary | ICD-10-CM | POA: Diagnosis not present

## 2019-08-09 DIAGNOSIS — E785 Hyperlipidemia, unspecified: Secondary | ICD-10-CM | POA: Diagnosis not present

## 2019-08-09 DIAGNOSIS — Z8546 Personal history of malignant neoplasm of prostate: Secondary | ICD-10-CM | POA: Diagnosis not present

## 2019-08-16 DIAGNOSIS — Z1211 Encounter for screening for malignant neoplasm of colon: Secondary | ICD-10-CM | POA: Diagnosis not present

## 2019-08-16 DIAGNOSIS — R748 Abnormal levels of other serum enzymes: Secondary | ICD-10-CM | POA: Diagnosis not present

## 2019-08-16 DIAGNOSIS — Z139 Encounter for screening, unspecified: Secondary | ICD-10-CM | POA: Diagnosis not present

## 2019-08-16 DIAGNOSIS — Z0000 Encounter for general adult medical examination without abnormal findings: Secondary | ICD-10-CM | POA: Diagnosis not present

## 2019-08-16 DIAGNOSIS — Z6825 Body mass index (BMI) 25.0-25.9, adult: Secondary | ICD-10-CM | POA: Diagnosis not present

## 2019-08-28 DIAGNOSIS — R748 Abnormal levels of other serum enzymes: Secondary | ICD-10-CM | POA: Diagnosis not present

## 2019-08-28 DIAGNOSIS — I2510 Atherosclerotic heart disease of native coronary artery without angina pectoris: Secondary | ICD-10-CM | POA: Diagnosis not present

## 2019-08-28 DIAGNOSIS — I252 Old myocardial infarction: Secondary | ICD-10-CM | POA: Diagnosis not present

## 2019-08-28 DIAGNOSIS — E7800 Pure hypercholesterolemia, unspecified: Secondary | ICD-10-CM | POA: Diagnosis not present

## 2019-09-24 DIAGNOSIS — Z20828 Contact with and (suspected) exposure to other viral communicable diseases: Secondary | ICD-10-CM | POA: Diagnosis not present

## 2019-09-24 DIAGNOSIS — R509 Fever, unspecified: Secondary | ICD-10-CM | POA: Diagnosis not present

## 2019-10-13 DIAGNOSIS — L03114 Cellulitis of left upper limb: Secondary | ICD-10-CM | POA: Diagnosis not present

## 2019-12-11 DIAGNOSIS — Z1331 Encounter for screening for depression: Secondary | ICD-10-CM | POA: Diagnosis not present

## 2019-12-11 DIAGNOSIS — Z139 Encounter for screening, unspecified: Secondary | ICD-10-CM | POA: Diagnosis not present

## 2019-12-11 DIAGNOSIS — Z7189 Other specified counseling: Secondary | ICD-10-CM | POA: Diagnosis not present

## 2019-12-11 DIAGNOSIS — Z136 Encounter for screening for cardiovascular disorders: Secondary | ICD-10-CM | POA: Diagnosis not present

## 2019-12-11 DIAGNOSIS — Z0000 Encounter for general adult medical examination without abnormal findings: Secondary | ICD-10-CM | POA: Diagnosis not present

## 2019-12-11 DIAGNOSIS — Z1339 Encounter for screening examination for other mental health and behavioral disorders: Secondary | ICD-10-CM | POA: Diagnosis not present

## 2019-12-11 DIAGNOSIS — E785 Hyperlipidemia, unspecified: Secondary | ICD-10-CM | POA: Diagnosis not present

## 2019-12-21 DIAGNOSIS — I2510 Atherosclerotic heart disease of native coronary artery without angina pectoris: Secondary | ICD-10-CM | POA: Diagnosis not present

## 2019-12-21 DIAGNOSIS — Z6826 Body mass index (BMI) 26.0-26.9, adult: Secondary | ICD-10-CM | POA: Diagnosis not present

## 2019-12-21 DIAGNOSIS — E785 Hyperlipidemia, unspecified: Secondary | ICD-10-CM | POA: Diagnosis not present

## 2019-12-21 DIAGNOSIS — D692 Other nonthrombocytopenic purpura: Secondary | ICD-10-CM | POA: Diagnosis not present

## 2019-12-28 DIAGNOSIS — I2510 Atherosclerotic heart disease of native coronary artery without angina pectoris: Secondary | ICD-10-CM | POA: Diagnosis not present

## 2019-12-28 DIAGNOSIS — Z136 Encounter for screening for cardiovascular disorders: Secondary | ICD-10-CM | POA: Diagnosis not present

## 2020-03-04 DIAGNOSIS — I252 Old myocardial infarction: Secondary | ICD-10-CM | POA: Diagnosis not present

## 2020-03-04 DIAGNOSIS — E7800 Pure hypercholesterolemia, unspecified: Secondary | ICD-10-CM | POA: Diagnosis not present

## 2020-03-04 DIAGNOSIS — I2510 Atherosclerotic heart disease of native coronary artery without angina pectoris: Secondary | ICD-10-CM | POA: Diagnosis not present

## 2020-05-08 DIAGNOSIS — E785 Hyperlipidemia, unspecified: Secondary | ICD-10-CM | POA: Diagnosis not present

## 2020-05-19 DIAGNOSIS — I2510 Atherosclerotic heart disease of native coronary artery without angina pectoris: Secondary | ICD-10-CM | POA: Diagnosis not present

## 2020-05-19 DIAGNOSIS — D692 Other nonthrombocytopenic purpura: Secondary | ICD-10-CM | POA: Diagnosis not present

## 2020-05-19 DIAGNOSIS — Z136 Encounter for screening for cardiovascular disorders: Secondary | ICD-10-CM | POA: Diagnosis not present

## 2020-05-19 DIAGNOSIS — Z6826 Body mass index (BMI) 26.0-26.9, adult: Secondary | ICD-10-CM | POA: Diagnosis not present

## 2020-05-19 DIAGNOSIS — E785 Hyperlipidemia, unspecified: Secondary | ICD-10-CM | POA: Diagnosis not present

## 2020-07-24 ENCOUNTER — Encounter: Payer: Self-pay | Admitting: Gastroenterology

## 2020-08-26 DIAGNOSIS — Z8546 Personal history of malignant neoplasm of prostate: Secondary | ICD-10-CM | POA: Diagnosis not present

## 2020-08-26 DIAGNOSIS — Z8616 Personal history of COVID-19: Secondary | ICD-10-CM | POA: Diagnosis not present

## 2020-08-26 DIAGNOSIS — I25118 Atherosclerotic heart disease of native coronary artery with other forms of angina pectoris: Secondary | ICD-10-CM | POA: Diagnosis not present

## 2020-08-26 DIAGNOSIS — D649 Anemia, unspecified: Secondary | ICD-10-CM | POA: Diagnosis not present

## 2020-08-26 DIAGNOSIS — Z23 Encounter for immunization: Secondary | ICD-10-CM | POA: Diagnosis not present

## 2020-08-26 DIAGNOSIS — R7303 Prediabetes: Secondary | ICD-10-CM | POA: Diagnosis not present

## 2020-08-26 DIAGNOSIS — R748 Abnormal levels of other serum enzymes: Secondary | ICD-10-CM | POA: Diagnosis not present

## 2020-08-26 DIAGNOSIS — K635 Polyp of colon: Secondary | ICD-10-CM | POA: Diagnosis not present

## 2020-09-11 ENCOUNTER — Ambulatory Visit (AMBULATORY_SURGERY_CENTER): Payer: Self-pay | Admitting: *Deleted

## 2020-09-11 ENCOUNTER — Encounter: Payer: Self-pay | Admitting: Gastroenterology

## 2020-09-11 ENCOUNTER — Other Ambulatory Visit: Payer: Self-pay

## 2020-09-11 VITALS — Ht 73.5 in | Wt 197.0 lb

## 2020-09-11 DIAGNOSIS — Z86010 Personal history of colonic polyps: Secondary | ICD-10-CM

## 2020-09-11 NOTE — Progress Notes (Signed)

## 2020-09-23 ENCOUNTER — Encounter: Payer: Self-pay | Admitting: Gastroenterology

## 2020-09-23 ENCOUNTER — Other Ambulatory Visit: Payer: Self-pay

## 2020-09-23 ENCOUNTER — Ambulatory Visit (AMBULATORY_SURGERY_CENTER): Payer: PPO | Admitting: Gastroenterology

## 2020-09-23 VITALS — BP 101/70 | HR 50 | Temp 97.3°F | Resp 13 | Ht 73.5 in | Wt 197.0 lb

## 2020-09-23 DIAGNOSIS — Z86010 Personal history of colonic polyps: Secondary | ICD-10-CM | POA: Diagnosis not present

## 2020-09-23 DIAGNOSIS — Z1211 Encounter for screening for malignant neoplasm of colon: Secondary | ICD-10-CM | POA: Diagnosis not present

## 2020-09-23 MED ORDER — SODIUM CHLORIDE 0.9 % IV SOLN: 500.0000 mL | Freq: Once | INTRAVENOUS | Status: DC

## 2020-09-23 NOTE — Op Note (Signed)
[place] Patient Name: Curtis Olson Procedure Date: 09/23/2020 11:36 AM MRN: [id number] Endoscopist: Jackquline Denmark , MD Age: 74 Referring MD:  Date of Birth: 1946-10-14 Gender: Male Account #: [id number] Procedure:                Colonoscopy Indications:              High risk colon cancer surveillance: Personal                            history of colonic polyps Medicines:                Monitored Anesthesia Care Procedure:                Pre-Anesthesia Assessment:                           - Prior to the procedure, a History and Physical                            was performed, and patient medications and                            allergies were reviewed. The patient's tolerance of                            previous anesthesia was also reviewed. The risks                            and benefits of the procedure and the sedation                            options and risks were discussed with the patient.                            All questions were answered, and informed consent                            was obtained. Prior Anticoagulants: The patient has                            taken no previous anticoagulant or antiplatelet                            agents. ASA Grade Assessment: II - A patient with                            mild systemic disease. After reviewing the risks                            and benefits, the patient was deemed in                            satisfactory condition to undergo the procedure.  After obtaining informed consent, the colonoscope                            was passed under direct vision. Throughout the                            procedure, the patient's blood pressure, pulse, and                            oxygen saturations were monitored continuously. The                            Colonoscope was introduced through the anus and                            advanced to the the cecum, identified by                             appendiceal orifice and ileocecal valve. The                            colonoscopy was performed without difficulty. The                            patient tolerated the procedure well. The quality                            of the bowel preparation was good. The ileocecal                            valve, appendiceal orifice, and rectum were                            photographed. Scope In: 11:42:52 AM Scope Out: 11:54:50 AM Scope Withdrawal Time: 0 hours 8 minutes 22 seconds  Total Procedure Duration: 0 hours 11 minutes 58 seconds  Findings:                 Multiple medium-mouthed diverticula were found in                            the sigmoid colon and few in ascending colon.                           Non-bleeding internal hemorrhoids were found during                            retroflexion. The hemorrhoids were Grade IV                            (internal hemorrhoids that prolapse and cannot be                            reduced manually).  The exam was otherwise without abnormality on                            direct and retroflexion views. Complications:            No immediate complications. Estimated Blood Loss:     Estimated blood loss: none. Impression:               - Moderate predominantly sigmoid diverticulosis                           - Non-bleeding internal hemorrhoids.                           - The examination was otherwise normal on direct                            and retroflexion views.                           - No specimens collected. Recommendation:           - Patient has a contact number available for                            emergencies. The signs and symptoms of potential                            delayed complications were discussed with the                            patient. Return to normal activities tomorrow.                            Written discharge instructions were provided to the                             patient.                           - High fiber diet.                           - Continue present medications.                           - Repeat colonoscopy is not recommended for                            screening purposes. Hence colonoscopy only if with                            any new problems.                           - Return to GI office PRN.                           -  Use Preparation H on as-needed basis.                           - The findings and recommendations were discussed                            with [name]. Jackquline Denmark, MD 09/23/2020 12:01:59 PM This report has been signed electronically.

## 2020-09-23 NOTE — Progress Notes (Signed)
Vital signs checked by:MO  The patient states no changes in medical or surgical history since pre-visit screening on 09/11/20.

## 2020-09-23 NOTE — Patient Instructions (Signed)
Handout on diverticulosis given.  High fiber diet. Use Preparation H as needed.    YOU HAD AN ENDOSCOPIC PROCEDURE TODAY AT [facility]:   Refer to the procedure report that was given to you for any specific questions about what was found during the examination.  If the procedure report does not answer your questions, please call your gastroenterologist to clarify.  If you requested that your care partner not be given the details of your procedure findings, then the procedure report has been included in a sealed envelope for you to review at your convenience later.  YOU SHOULD EXPECT: Some feelings of bloating in the abdomen. Passage of more gas than usual.  Walking can help get rid of the air that was put into your GI tract during the procedure and reduce the bloating. If you had a lower endoscopy (such as a colonoscopy or flexible sigmoidoscopy) you may notice spotting of blood in your stool or on the toilet paper. If you underwent a bowel prep for your procedure, you may not have a normal bowel movement for a few days.  Please Note:  You might notice some irritation and congestion in your nose or some drainage.  This is from the oxygen used during your procedure.  There is no need for concern and it should clear up in a day or so.  SYMPTOMS TO REPORT IMMEDIATELY:   Following lower endoscopy (colonoscopy or flexible sigmoidoscopy):  Excessive amounts of blood in the stool  Significant tenderness or worsening of abdominal pains  Swelling of the abdomen that is new, acute  Fever of 100F or higher   For urgent or emergent issues, a gastroenterologist can be reached at any hour by calling [phone]. Do not use MyChart messaging for urgent concerns.    DIET:  We do recommend a small meal at first, but then you may proceed to your regular diet.  Drink plenty of fluids but you should avoid alcoholic beverages for 24 hours.  ACTIVITY:  You should plan to take it easy for  the rest of today and you should NOT DRIVE or use heavy machinery until tomorrow (because of the sedation medicines used during the test).    FOLLOW UP: Our staff will call the number listed on your records 48-72 hours following your procedure to check on you and address any questions or concerns that you may have regarding the information given to you following your procedure. If we do not reach you, we will leave a message.  We will attempt to reach you two times.  During this call, we will ask if you have developed any symptoms of COVID 19. If you develop any symptoms (ie: fever, flu-like symptoms, shortness of breath, cough etc.) before then, please call [phone].  If you test positive for Covid 19 in the 2 weeks post procedure, please call and report this information to [place].    If any biopsies were taken you will be contacted by phone or by letter within the next 1-3 weeks.  Please call us at [phone] if you have not heard about the biopsies in 3 weeks.    SIGNATURES/CONFIDENTIALITY: You and/or your care partner have signed paperwork which will be entered into your electronic medical record.  These signatures attest to the fact that that the information above on your After Visit Summary has been reviewed and is understood.  Full responsibility of the confidentiality of this discharge information lies with you and/or your care-partner.

## 2020-09-23 NOTE — Progress Notes (Signed)
Pt Drowsy. VSS. To PACU, report to RN. No anesthetic complications noted.  

## 2020-09-24 ENCOUNTER — Telehealth: Payer: Self-pay | Admitting: *Deleted

## 2020-09-24 NOTE — Telephone Encounter (Signed)
  Follow up Call-  Call back number 09/23/2020  Post procedure Call Back phone  # [phone]  Permission to leave phone message Yes  Some recent data might be hidden     Patient questions:  Do you have a fever, pain , or abdominal swelling? No. Pain Score  0 *  Have you tolerated food without any problems? Yes.    Have you been able to return to your normal activities? Yes.    Do you have any questions about your discharge instructions: Diet   No. Medications  No. Follow up visit  No.  Do you have questions or concerns about your Care? No.  Actions: * If pain score is 4 or above: 1. No action needed, pain <4.Have you developed a fever since your procedure? no  2.   Have you had an respiratory symptoms (SOB or cough) since your procedure? no  3.   Have you tested positive for COVID 19 since your procedure no  4.   Have you had any family members/close contacts diagnosed with the COVID 19 since your procedure?  no   If yes to any of these questions please route to Joylene John, RN and Joella Prince, RN

## 2021-03-10 DIAGNOSIS — E782 Mixed hyperlipidemia: Secondary | ICD-10-CM | POA: Diagnosis not present

## 2021-03-10 DIAGNOSIS — R7303 Prediabetes: Secondary | ICD-10-CM | POA: Diagnosis not present

## 2021-03-10 DIAGNOSIS — Z0000 Encounter for general adult medical examination without abnormal findings: Secondary | ICD-10-CM | POA: Diagnosis not present

## 2021-03-10 DIAGNOSIS — Z8546 Personal history of malignant neoplasm of prostate: Secondary | ICD-10-CM | POA: Diagnosis not present

## 2021-03-10 DIAGNOSIS — I25118 Atherosclerotic heart disease of native coronary artery with other forms of angina pectoris: Secondary | ICD-10-CM | POA: Diagnosis not present

## 2021-03-10 DIAGNOSIS — J310 Chronic rhinitis: Secondary | ICD-10-CM | POA: Diagnosis not present

## 2021-03-11 DIAGNOSIS — R7303 Prediabetes: Secondary | ICD-10-CM | POA: Diagnosis not present

## 2021-03-11 DIAGNOSIS — I2510 Atherosclerotic heart disease of native coronary artery without angina pectoris: Secondary | ICD-10-CM | POA: Diagnosis not present

## 2021-03-11 DIAGNOSIS — Z955 Presence of coronary angioplasty implant and graft: Secondary | ICD-10-CM | POA: Diagnosis not present

## 2021-03-11 DIAGNOSIS — Z8546 Personal history of malignant neoplasm of prostate: Secondary | ICD-10-CM | POA: Diagnosis not present

## 2021-03-11 DIAGNOSIS — I25118 Atherosclerotic heart disease of native coronary artery with other forms of angina pectoris: Secondary | ICD-10-CM | POA: Diagnosis not present

## 2021-03-11 DIAGNOSIS — I252 Old myocardial infarction: Secondary | ICD-10-CM | POA: Diagnosis not present

## 2021-03-11 DIAGNOSIS — E782 Mixed hyperlipidemia: Secondary | ICD-10-CM | POA: Diagnosis not present

## 2021-04-20 DIAGNOSIS — G5761 Lesion of plantar nerve, right lower limb: Secondary | ICD-10-CM | POA: Diagnosis not present

## 2021-06-01 DIAGNOSIS — R3914 Feeling of incomplete bladder emptying: Secondary | ICD-10-CM | POA: Diagnosis not present

## 2021-06-01 DIAGNOSIS — Z9079 Acquired absence of other genital organ(s): Secondary | ICD-10-CM | POA: Diagnosis not present

## 2021-06-01 DIAGNOSIS — N390 Urinary tract infection, site not specified: Secondary | ICD-10-CM | POA: Diagnosis not present

## 2021-06-01 DIAGNOSIS — R300 Dysuria: Secondary | ICD-10-CM | POA: Diagnosis not present

## 2021-06-01 DIAGNOSIS — R3915 Urgency of urination: Secondary | ICD-10-CM | POA: Diagnosis not present

## 2021-06-15 DIAGNOSIS — H60501 Unspecified acute noninfective otitis externa, right ear: Secondary | ICD-10-CM | POA: Diagnosis not present

## 2021-06-15 DIAGNOSIS — H6091 Unspecified otitis externa, right ear: Secondary | ICD-10-CM | POA: Diagnosis not present

## 2021-06-15 DIAGNOSIS — H9201 Otalgia, right ear: Secondary | ICD-10-CM | POA: Diagnosis not present

## 2021-06-26 DIAGNOSIS — H6591 Unspecified nonsuppurative otitis media, right ear: Secondary | ICD-10-CM | POA: Diagnosis not present

## 2021-06-26 DIAGNOSIS — H6981 Other specified disorders of Eustachian tube, right ear: Secondary | ICD-10-CM | POA: Diagnosis not present

## 2021-06-26 DIAGNOSIS — H9191 Unspecified hearing loss, right ear: Secondary | ICD-10-CM | POA: Diagnosis not present

## 2021-06-26 DIAGNOSIS — Z8669 Personal history of other diseases of the nervous system and sense organs: Secondary | ICD-10-CM | POA: Diagnosis not present

## 2021-07-10 DIAGNOSIS — H9191 Unspecified hearing loss, right ear: Secondary | ICD-10-CM | POA: Diagnosis not present

## 2021-07-10 DIAGNOSIS — H73001 Acute myringitis, right ear: Secondary | ICD-10-CM | POA: Diagnosis not present

## 2021-07-10 DIAGNOSIS — Z8669 Personal history of other diseases of the nervous system and sense organs: Secondary | ICD-10-CM | POA: Diagnosis not present

## 2021-08-26 DIAGNOSIS — R399 Unspecified symptoms and signs involving the genitourinary system: Secondary | ICD-10-CM | POA: Diagnosis not present

## 2021-08-26 DIAGNOSIS — R300 Dysuria: Secondary | ICD-10-CM | POA: Diagnosis not present

## 2021-08-26 DIAGNOSIS — Z23 Encounter for immunization: Secondary | ICD-10-CM | POA: Diagnosis not present

## 2021-09-09 DIAGNOSIS — R7303 Prediabetes: Secondary | ICD-10-CM | POA: Diagnosis not present

## 2021-09-09 DIAGNOSIS — I2510 Atherosclerotic heart disease of native coronary artery without angina pectoris: Secondary | ICD-10-CM | POA: Diagnosis not present

## 2021-09-09 DIAGNOSIS — E782 Mixed hyperlipidemia: Secondary | ICD-10-CM | POA: Diagnosis not present

## 2021-09-09 DIAGNOSIS — Z8546 Personal history of malignant neoplasm of prostate: Secondary | ICD-10-CM | POA: Diagnosis not present

## 2021-09-09 DIAGNOSIS — R32 Unspecified urinary incontinence: Secondary | ICD-10-CM | POA: Diagnosis not present

## 2021-09-09 DIAGNOSIS — R319 Hematuria, unspecified: Secondary | ICD-10-CM | POA: Diagnosis not present

## 2021-09-10 DIAGNOSIS — Z8546 Personal history of malignant neoplasm of prostate: Secondary | ICD-10-CM | POA: Diagnosis not present

## 2021-09-10 DIAGNOSIS — N3942 Incontinence without sensory awareness: Secondary | ICD-10-CM | POA: Diagnosis not present

## 2021-09-11 DIAGNOSIS — I2510 Atherosclerotic heart disease of native coronary artery without angina pectoris: Secondary | ICD-10-CM | POA: Diagnosis not present

## 2021-09-11 DIAGNOSIS — E782 Mixed hyperlipidemia: Secondary | ICD-10-CM | POA: Diagnosis not present

## 2021-09-11 DIAGNOSIS — I252 Old myocardial infarction: Secondary | ICD-10-CM | POA: Diagnosis not present

## 2021-09-11 DIAGNOSIS — Z955 Presence of coronary angioplasty implant and graft: Secondary | ICD-10-CM | POA: Diagnosis not present

## 2021-11-23 DIAGNOSIS — C44319 Basal cell carcinoma of skin of other parts of face: Secondary | ICD-10-CM | POA: Diagnosis not present

## 2021-11-23 DIAGNOSIS — D485 Neoplasm of uncertain behavior of skin: Secondary | ICD-10-CM | POA: Diagnosis not present

## 2021-12-29 DIAGNOSIS — C44319 Basal cell carcinoma of skin of other parts of face: Secondary | ICD-10-CM | POA: Diagnosis not present

## 2022-04-20 DIAGNOSIS — E782 Mixed hyperlipidemia: Secondary | ICD-10-CM | POA: Diagnosis not present

## 2022-04-20 DIAGNOSIS — Z0000 Encounter for general adult medical examination without abnormal findings: Secondary | ICD-10-CM | POA: Diagnosis not present

## 2022-04-20 DIAGNOSIS — Z8546 Personal history of malignant neoplasm of prostate: Secondary | ICD-10-CM | POA: Diagnosis not present

## 2022-04-20 DIAGNOSIS — I2510 Atherosclerotic heart disease of native coronary artery without angina pectoris: Secondary | ICD-10-CM | POA: Diagnosis not present

## 2022-04-20 DIAGNOSIS — R7303 Prediabetes: Secondary | ICD-10-CM | POA: Diagnosis not present

## 2022-05-07 DIAGNOSIS — R350 Frequency of micturition: Secondary | ICD-10-CM | POA: Diagnosis not present

## 2022-05-07 DIAGNOSIS — N2889 Other specified disorders of kidney and ureter: Secondary | ICD-10-CM | POA: Diagnosis not present

## 2022-05-07 DIAGNOSIS — M76891 Other specified enthesopathies of right lower limb, excluding foot: Secondary | ICD-10-CM | POA: Diagnosis not present

## 2022-05-07 DIAGNOSIS — M791 Myalgia, unspecified site: Secondary | ICD-10-CM | POA: Diagnosis not present

## 2022-07-26 ENCOUNTER — Telehealth: Payer: Self-pay | Admitting: *Deleted

## 2022-07-26 NOTE — Patient Outreach (Signed)
  Care Coordination   07/26/2022 Name: Curtis Olson MRN: [id number] DOB: April 15, 1946   Care Coordination Outreach Attempts:  An unsuccessful telephone outreach was attempted today to offer the patient information about available care coordination services as a benefit of their health plan.   Follow Up Plan:  Additional outreach attempts will be made to offer the patient care coordination information and services.   Encounter Outcome:  No Answer  Care Coordination Interventions Activated:  No   Care Coordination Interventions:  No, not indicated    Jacqlyn Larsen [facility] [place] RN Care Coordinator [phone]

## 2022-07-28 ENCOUNTER — Telehealth: Payer: Self-pay | Admitting: *Deleted

## 2022-07-28 NOTE — Patient Outreach (Signed)
  Care Coordination   Initial Visit Note   07/28/2022 Name: Curtis Olson MRN: [id number] DOB: 01-03-1946  Curtis Olson is a 76 y.o. year old male who sees Curtis Olson., MD for primary care. I spoke with  Curtis Olson (spouse) by phone today (2nd attempt outreach)  What matters to the patients health and wellness today?  Per spouse, pt is not at home    Goals Addressed   None     SDOH assessments and interventions completed:  No     Care Coordination Interventions Activated:  No  Care Coordination Interventions:  No, not indicated   Follow up plan:  will outreach patient at later date    Encounter Outcome:  Pt. Request to Call Back   Curtis Olson [facility], BSN [facility] RN Care Coordinator [phone]

## 2022-07-30 ENCOUNTER — Encounter: Payer: Self-pay | Admitting: *Deleted

## 2022-07-30 ENCOUNTER — Telehealth: Payer: Self-pay | Admitting: *Deleted

## 2022-07-30 NOTE — Patient Outreach (Signed)
  Care Coordination   Initial Visit Note   07/30/2022 Name: Curtis Olson MRN: [id number] DOB: 30-Sep-1946  Curtis Olson is a 76 y.o. year old male who sees Raina Mina., MD for primary care. I spoke with  Ted Mcalpine by phone today.  What matters to the patients health and wellness today?  "to maintain"    Goals Addressed               This Visit's Progress     COMPLETED: "to maintain" (pt-stated)        Care Coordination Interventions: Patient interviewed about adult health maintenance status including  importance of Annual Wellness Visit, pt reports has already been completed this year Provided education about importance of taking medication as prescribed Provider established cholesterol goals reviewed Counseled on importance of regular laboratory monitoring as prescribed Reviewed importance of limiting foods high in cholesterol Reviewed exercise goals and target of 150 minutes per week Assessed social determinant of health barriers  Care coordination program explained, pt agreeable to today's outreach but declines any further outreach         SDOH assessments and interventions completed:  Yes  SDOH Interventions Today    Flowsheet Row Most Recent Value  SDOH Interventions   Food Insecurity Interventions Intervention Not Indicated  Transportation Interventions Intervention Not Indicated        Care Coordination Interventions Activated:  Yes  Care Coordination Interventions:  Yes, provided   Follow up plan: No further intervention required.   Encounter Outcome:  Pt. Visit Completed   Jacqlyn Larsen [facility], BSN [facility] RN Care Coordinator [phone]

## 2022-09-13 DIAGNOSIS — I2510 Atherosclerotic heart disease of native coronary artery without angina pectoris: Secondary | ICD-10-CM | POA: Diagnosis not present

## 2022-09-13 DIAGNOSIS — I252 Old myocardial infarction: Secondary | ICD-10-CM | POA: Diagnosis not present

## 2022-09-13 DIAGNOSIS — E782 Mixed hyperlipidemia: Secondary | ICD-10-CM | POA: Diagnosis not present

## 2022-09-13 DIAGNOSIS — Z955 Presence of coronary angioplasty implant and graft: Secondary | ICD-10-CM | POA: Diagnosis not present

## 2022-10-15 DIAGNOSIS — J019 Acute sinusitis, unspecified: Secondary | ICD-10-CM | POA: Diagnosis not present

## 2024-12-01 ENCOUNTER — Encounter (HOSPITAL_BASED_OUTPATIENT_CLINIC_OR_DEPARTMENT_OTHER): Payer: Self-pay

## 2024-12-01 ENCOUNTER — Ambulatory Visit (HOSPITAL_BASED_OUTPATIENT_CLINIC_OR_DEPARTMENT_OTHER)
Admission: EM | Admit: 2024-12-01 | Discharge: 2024-12-01 | Disposition: A | Discharge status: Home / Self Care | Attending: Family Medicine | Admitting: Family Medicine

## 2024-12-01 DIAGNOSIS — J019 Acute sinusitis, unspecified: Secondary | ICD-10-CM

## 2024-12-01 MED ORDER — DOXYCYCLINE HYCLATE 100 MG PO CAPS: 100.0000 mg | ORAL_CAPSULE | Freq: Two times a day (BID) | ORAL | 0 refills | Status: AC

## 2024-12-01 MED ORDER — CEFDINIR 300 MG PO CAPS: 600.0000 mg | ORAL_CAPSULE | Freq: Every day | ORAL | 0 refills | Status: DC

## 2024-12-01 MED ORDER — FLUTICASONE PROPIONATE 50 MCG/ACT NA SUSP: 2.0000 | Freq: Every day | NASAL | 0 refills | Status: AC

## 2024-12-01 NOTE — Discharge Instructions (Addendum)
 Take doxycycline  100 mg --1 capsule 2 times daily for 7 days   Fluticasone /Flonase  nose spray--put 2 sprays in each nostril once daily

## 2024-12-01 NOTE — ED Triage Notes (Signed)
 Pt c/o nasal congestion, right sided facial pain, and occ cough for around a week. He has been working cutting cedar planks and is not sure if it is due to breathing in the dust. He has been using a sinus rinse and a nasal spray with temporary relief.

## 2024-12-01 NOTE — ED Provider Notes (Addendum)
 " [facility]    CSN: [id number] Arrival date & time: 12/01/24  1031      History   Chief Complaint Chief Complaint  Patient presents with   Nasal Congestion    HPI Curtis Olson is a 79 y.o. male.   HPI  Here for cough and congestion and sinus pressure. Symptoms began over a week ago.  Possibly some subjective fever at 1 point but really has not measured any fever.  No chills or aches.  He is not having some right sided sinus pressure.  He has trouble taking statins.    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     Patient Active Problem List   Diagnosis Date Noted   NSTEMI (non-ST elevated myocardial infarction) (HCC) 11/02/2018   Postoperative examination 06/22/2018   Non-recurrent bilateral inguinal hernia without obstruction or gangrene 06/07/2018    Past Surgical History:  Procedure Laterality Date   CARDIAC CATHETERIZATION     COLONOSCOPY     POLYPECTOMY     ROBOT ASSISTED LAPAROSCOPIC RADICAL PROSTATECTOMY     ROTATOR CUFF REPAIR Bilateral    surgery as infant     to correct defects    triple hernia repair          Home Medications    Prior to Admission medications  Medication Sig Start Date End Date Taking? Authorizing Provider  aspirin EC 81 MG tablet Take 81 mg by mouth daily. 11/02/18  Yes [provider]  doxycycline  (VIBRAMYCIN ) 100 MG capsule Take 1 capsule (100 mg total) by mouth 2 (two) times daily for 7 days. 12/01/24 12/08/24 Yes Vonna Sharlet POUR, MD  fluticasone  (FLONASE ) 50 MCG/ACT nasal spray Place 2 sprays into both nostrils daily. 12/01/24  Yes Kida Digiulio K, MD  ipratropium (ATROVENT) 0.06 % nasal spray Place 2 sprays into the nose. 11/09/23  Yes [provider]  ascorbic acid (VITAMIN C) 1000 MG tablet Take by mouth.    [provider]  Cholecalciferol 25 MCG (1000 UT) tablet Take by mouth.    [provider]  Coenzyme Q10 100 MG capsule Take by mouth. Includes Acetyl- CArnitine and Alpha lopic acid    [provider]  cyanocobalamin 1000 MCG tablet Take by mouth. Every other day    [provider]  Omega-3 1000 MG CAPS Take by mouth.    [provider]  pravastatin (PRAVACHOL) 20 MG tablet Take 20 mg by mouth daily.    [provider]  TURMERIC-GINGER PO Take 1 capsule by mouth daily.    [provider]    Family History Family History  Problem Relation Age of Onset   Colon cancer Neg Hx    Colon polyps Neg Hx    Esophageal cancer Neg Hx    Rectal cancer Neg Hx    Stomach cancer Neg Hx     Social History Social History[1]   Allergies   Statins   Review of Systems Review of Systems   Physical Exam Triage Vital Signs ED Triage Vitals  Encounter Vitals Group     BP 12/01/24 1053 100/65     Girls Systolic BP Percentile --  Girls Diastolic BP Percentile --      Boys Systolic BP Percentile --      Boys Diastolic BP Percentile --      Pulse Rate 12/01/24 1053 63     Resp 12/01/24 1053 20     Temp 12/01/24 1053 97.7 F (36.5 C)     Temp Source 12/01/24 1053 Oral     SpO2 12/01/24 1053 98 %     Weight --      Height --      Head Circumference --      Peak Flow --      Pain Score 12/01/24 1050 4     Pain Loc --      Pain Education --      Exclude from Growth Chart --    No data found.  Updated Vital Signs BP 100/65 (BP Location: Right Arm)   Pulse 63   Temp 97.7 F (36.5 C) (Oral)   Resp 20   SpO2 98%   Visual Acuity Right Eye Distance:   Left Eye Distance:   Bilateral Distance:    Right Eye Near:   Left Eye Near:    Bilateral Near:     Physical Exam Vitals reviewed.  Constitutional:      General: He is not in acute distress.     Appearance: He is not ill-appearing, toxic-appearing or diaphoretic.  HENT:     Right Ear: Tympanic membrane and ear canal normal.     Left Ear: Tympanic membrane and ear canal normal.     Nose: Congestion present.     Mouth/Throat:     Mouth: Mucous membranes are moist.     Comments: There is clear exudate draining in the posterior oropharynx. Eyes:     Extraocular Movements: Extraocular movements intact.     Conjunctiva/sclera: Conjunctivae normal.     Pupils: Pupils are equal, round, and reactive to light.  Cardiovascular:     Rate and Rhythm: Normal rate and regular rhythm.     Heart sounds: No murmur heard. Pulmonary:     Effort: Pulmonary effort is normal. No respiratory distress.     Breath sounds: Normal breath sounds. No stridor. No wheezing, rhonchi or rales.  Musculoskeletal:     Cervical back: Neck supple.  Lymphadenopathy:     Cervical: No cervical adenopathy.  Skin:    Coloration: Skin is not jaundiced or pale.  Neurological:     General: No focal deficit present.     Mental Status: He is alert and oriented to person, place, and time.  Psychiatric:        Behavior: Behavior normal.      UC Treatments / Results  Labs (all labs ordered are listed, but only abnormal results are displayed) Labs Reviewed - No data to display  EKG   Radiology No results found.  Procedures Procedures (including critical care time)  Medications Ordered in UC Medications - No data to display  Initial Impression / Assessment and Plan / UC Course  I have reviewed the triage vital signs and the nursing notes.  Pertinent labs & imaging results that were available during my care of the patient were reviewed by me and considered in my medical decision making (see chart for details).     Omnicef  is sent in to treat acute sinusitis, due to length of symptoms.  Flonase  is also sent in  Wife expressed concern that she had had C. difficile after taking Omnicef  last year.  I  decided going to change antibiotics to doxycycline  as it is less likely to do that.  The nurse did discuss within the can always take a probiotic also with antibiotics. Final Clinical Impressions(s) / UC Diagnoses   Final diagnoses:  Acute sinusitis, recurrence not specified, unspecified location     Discharge Instructions      Take doxycycline  100 mg --1 capsule 2 times daily for 7 days   Fluticasone /Flonase  nose spray--put 2 sprays in each nostril once daily      ED Prescriptions     Medication Sig Dispense Auth. Provider   cefdinir  (OMNICEF ) 300 MG capsule  (Status: Discontinued) Take 2 capsules (600 mg total) by mouth daily for 7 days. 14 capsule Imoni Kohen K, MD   fluticasone  (FLONASE ) 50 MCG/ACT nasal spray Place 2 sprays into both nostrils daily. 16 g Vonna Sharlet POUR, MD   doxycycline  (VIBRAMYCIN ) 100 MG capsule Take 1 capsule (100 mg total) by mouth 2 (two) times daily for 7 days. 14 capsule Vonna, Tzipora Mcinroy K, MD      PDMP not reviewed this encounter.    Vonna Sharlet POUR, MD 12/01/24 1119     [1]  Social History Tobacco Use   Smoking status: Never   Smokeless tobacco: Former    Types: Engineer, Drilling   Vaping status: Never Used  Substance Use Topics   Alcohol  use: Not Currently   Drug use: Never     Vonna Sharlet POUR, MD 12/01/24 1125  "
